# Patient Record
Sex: Female | Born: 1945 | Race: White | Hispanic: No | Marital: Married
Health system: Southern US, Community
[De-identification: ages and names within clinical notes are randomized; demographics above are authoritative.]

---

## 2006-02-01 ENCOUNTER — Ambulatory Visit: Payer: Self-pay

## 2014-02-27 ENCOUNTER — Ambulatory Visit: Payer: Self-pay | Admitting: Family Medicine

## 2014-02-28 ENCOUNTER — Ambulatory Visit: Payer: Self-pay | Admitting: Hematology and Oncology

## 2014-03-03 ENCOUNTER — Ambulatory Visit: Payer: Self-pay | Admitting: Hematology and Oncology

## 2014-03-03 LAB — COMPREHENSIVE METABOLIC PANEL
ALK PHOS: 118 U/L — AB
Albumin: 3.7 g/dL (ref 3.4–5.0)
Anion Gap: 6 — ABNORMAL LOW (ref 7–16)
BILIRUBIN TOTAL: 0.5 mg/dL (ref 0.2–1.0)
BUN: 12 mg/dL (ref 7–18)
Calcium, Total: 11.4 mg/dL — ABNORMAL HIGH (ref 8.5–10.1)
Chloride: 100 mmol/L (ref 98–107)
Co2: 32 mmol/L (ref 21–32)
Creatinine: 0.7 mg/dL (ref 0.60–1.30)
Glucose: 93 mg/dL (ref 65–99)
OSMOLALITY: 275 (ref 275–301)
Potassium: 5.4 mmol/L — ABNORMAL HIGH (ref 3.5–5.1)
SGOT(AST): 19 U/L (ref 15–37)
SGPT (ALT): 17 U/L (ref 12–78)
Sodium: 138 mmol/L (ref 136–145)
TOTAL PROTEIN: 8 g/dL (ref 6.4–8.2)

## 2014-03-03 LAB — CBC CANCER CENTER
BASOS PCT: 0.9 %
Basophil #: 0.1 x10 3/mm (ref 0.0–0.1)
EOS PCT: 3.6 %
Eosinophil #: 0.3 x10 3/mm (ref 0.0–0.7)
HCT: 39.7 % (ref 35.0–47.0)
HGB: 13.4 g/dL (ref 12.0–16.0)
Lymphocyte #: 2.4 x10 3/mm (ref 1.0–3.6)
Lymphocyte %: 26.9 %
MCH: 28.5 pg (ref 26.0–34.0)
MCHC: 33.8 g/dL (ref 32.0–36.0)
MCV: 84 fL (ref 80–100)
MONO ABS: 0.7 x10 3/mm (ref 0.2–0.9)
Monocyte %: 8 %
Neutrophil #: 5.4 x10 3/mm (ref 1.4–6.5)
Neutrophil %: 60.6 %
PLATELETS: 365 x10 3/mm (ref 150–440)
RBC: 4.72 10*6/uL (ref 3.80–5.20)
RDW: 14.7 % — AB (ref 11.5–14.5)
WBC: 8.9 x10 3/mm (ref 3.6–11.0)

## 2014-03-03 LAB — PROTIME-INR
INR: 1
Prothrombin Time: 13.3 secs (ref 11.5–14.7)

## 2014-03-04 ENCOUNTER — Ambulatory Visit: Payer: Self-pay | Admitting: Hematology and Oncology

## 2014-03-05 ENCOUNTER — Ambulatory Visit: Payer: Self-pay | Admitting: Hematology and Oncology

## 2014-03-19 ENCOUNTER — Ambulatory Visit: Payer: Self-pay | Admitting: Hematology and Oncology

## 2014-04-03 LAB — COMPREHENSIVE METABOLIC PANEL
ALK PHOS: 116 U/L
Albumin: 3.3 g/dL — ABNORMAL LOW (ref 3.4–5.0)
Anion Gap: 6 — ABNORMAL LOW (ref 7–16)
BUN: 12 mg/dL (ref 7–18)
Bilirubin,Total: 0.3 mg/dL (ref 0.2–1.0)
Calcium, Total: 11.4 mg/dL — ABNORMAL HIGH (ref 8.5–10.1)
Chloride: 99 mmol/L (ref 98–107)
Co2: 34 mmol/L — ABNORMAL HIGH (ref 21–32)
Creatinine: 0.87 mg/dL (ref 0.60–1.30)
EGFR (Non-African Amer.): >60
Glucose: 92 mg/dL (ref 65–99)
OSMOLALITY: 277 (ref 275–301)
Potassium: 4.2 mmol/L (ref 3.5–5.1)
SGOT(AST): 17 U/L (ref 15–37)
SGPT (ALT): 12 U/L (ref 12–78)
SODIUM: 139 mmol/L (ref 136–145)
Total Protein: 7.6 g/dL (ref 6.4–8.2)

## 2014-04-03 LAB — CBC CANCER CENTER
BASOS ABS: 0.1 x10 3/mm (ref 0.0–0.1)
Basophil %: 1 %
EOS PCT: 4.7 %
Eosinophil #: 0.4 x10 3/mm (ref 0.0–0.7)
HCT: 36.6 % (ref 35.0–47.0)
HGB: 12.1 g/dL (ref 12.0–16.0)
Lymphocyte #: 2.1 x10 3/mm (ref 1.0–3.6)
Lymphocyte %: 27 %
MCH: 28.2 pg (ref 26.0–34.0)
MCHC: 33.1 g/dL (ref 32.0–36.0)
MCV: 85 fL (ref 80–100)
MONO ABS: 0.7 x10 3/mm (ref 0.2–0.9)
MONOS PCT: 8.5 %
NEUTROS PCT: 58.8 %
Neutrophil #: 4.6 x10 3/mm (ref 1.4–6.5)
Platelet: 333 x10 3/mm (ref 150–440)
RBC: 4.3 10*6/uL (ref 3.80–5.20)
RDW: 14.7 % — ABNORMAL HIGH (ref 11.5–14.5)
WBC: 7.8 x10 3/mm (ref 3.6–11.0)

## 2014-04-10 LAB — CBC CANCER CENTER
BASOS PCT: 0.7 %
Basophil #: 0.1 x10 3/mm (ref 0.0–0.1)
EOS PCT: 3.9 %
Eosinophil #: 0.3 x10 3/mm (ref 0.0–0.7)
HCT: 35.3 % (ref 35.0–47.0)
HGB: 11.6 g/dL — AB (ref 12.0–16.0)
LYMPHS PCT: 18.6 %
Lymphocyte #: 1.4 x10 3/mm (ref 1.0–3.6)
MCH: 28 pg (ref 26.0–34.0)
MCHC: 32.9 g/dL (ref 32.0–36.0)
MCV: 85 fL (ref 80–100)
MONO ABS: 0.7 x10 3/mm (ref 0.2–0.9)
MONOS PCT: 9.4 %
Neutrophil #: 5.2 x10 3/mm (ref 1.4–6.5)
Neutrophil %: 67.4 %
PLATELETS: 307 x10 3/mm (ref 150–440)
RBC: 4.15 10*6/uL (ref 3.80–5.20)
RDW: 14.8 % — ABNORMAL HIGH (ref 11.5–14.5)
WBC: 7.7 x10 3/mm (ref 3.6–11.0)

## 2014-04-10 LAB — COMPREHENSIVE METABOLIC PANEL
ALBUMIN: 3.2 g/dL — AB (ref 3.4–5.0)
Alkaline Phosphatase: 108 U/L
Anion Gap: 7 (ref 7–16)
BUN: 10 mg/dL (ref 7–18)
Bilirubin,Total: 0.2 mg/dL (ref 0.2–1.0)
CO2: 30 mmol/L (ref 21–32)
CREATININE: 0.7 mg/dL (ref 0.60–1.30)
Calcium, Total: 9.1 mg/dL (ref 8.5–10.1)
Chloride: 99 mmol/L (ref 98–107)
EGFR (Non-African Amer.): >60
Glucose: 120 mg/dL — ABNORMAL HIGH (ref 65–99)
Osmolality: 272 (ref 275–301)
Potassium: 4.1 mmol/L (ref 3.5–5.1)
SGOT(AST): 12 U/L — ABNORMAL LOW (ref 15–37)
SGPT (ALT): 15 U/L
SODIUM: 136 mmol/L (ref 136–145)
Total Protein: 7.8 g/dL (ref 6.4–8.2)

## 2014-04-15 LAB — CBC CANCER CENTER
Basophil #: 0 x10 3/mm (ref 0.0–0.1)
Basophil %: 0.5 %
EOS PCT: 2.5 %
Eosinophil #: 0.2 x10 3/mm (ref 0.0–0.7)
HCT: 32.9 % — AB (ref 35.0–47.0)
HGB: 11 g/dL — AB (ref 12.0–16.0)
LYMPHS ABS: 1.2 x10 3/mm (ref 1.0–3.6)
Lymphocyte %: 17.5 %
MCH: 28.7 pg (ref 26.0–34.0)
MCHC: 33.5 g/dL (ref 32.0–36.0)
MCV: 85 fL (ref 80–100)
MONO ABS: 0.6 x10 3/mm (ref 0.2–0.9)
Monocyte %: 8.3 %
NEUTROS ABS: 5.1 x10 3/mm (ref 1.4–6.5)
Neutrophil %: 71.2 %
PLATELETS: 320 x10 3/mm (ref 150–440)
RBC: 3.85 10*6/uL (ref 3.80–5.20)
RDW: 15 % — AB (ref 11.5–14.5)
WBC: 7.1 x10 3/mm (ref 3.6–11.0)

## 2014-04-15 LAB — URINALYSIS, COMPLETE
BILIRUBIN, UR: NEGATIVE
Bacteria: NONE SEEN
Blood: NEGATIVE
Glucose,UR: NEGATIVE mg/dL (ref 0–75)
Ketone: NEGATIVE
LEUKOCYTE ESTERASE: NEGATIVE
NITRITE: NEGATIVE
PH: 7 (ref 4.5–8.0)
Protein: NEGATIVE
SPECIFIC GRAVITY: 1.008 (ref 1.003–1.030)
Squamous Epithelial: 5

## 2014-04-15 LAB — COMPREHENSIVE METABOLIC PANEL
ALBUMIN: 3 g/dL — AB (ref 3.4–5.0)
ANION GAP: 7 (ref 7–16)
Alkaline Phosphatase: 104 U/L
BUN: 13 mg/dL (ref 7–18)
Bilirubin,Total: 0.4 mg/dL (ref 0.2–1.0)
CHLORIDE: 96 mmol/L — AB (ref 98–107)
CREATININE: 0.62 mg/dL (ref 0.60–1.30)
Calcium, Total: 9.5 mg/dL (ref 8.5–10.1)
Co2: 32 mmol/L (ref 21–32)
EGFR (African American): >60
EGFR (Non-African Amer.): >60
Glucose: 126 mg/dL — ABNORMAL HIGH (ref 65–99)
OSMOLALITY: 272 (ref 275–301)
Potassium: 3.7 mmol/L (ref 3.5–5.1)
SGOT(AST): 10 U/L — ABNORMAL LOW (ref 15–37)
SGPT (ALT): 14 U/L
Sodium: 135 mmol/L — ABNORMAL LOW (ref 136–145)
Total Protein: 7.4 g/dL (ref 6.4–8.2)

## 2014-04-17 ENCOUNTER — Emergency Department: Payer: Self-pay | Admitting: Emergency Medicine

## 2014-04-17 LAB — CBC CANCER CENTER
BASOS PCT: 0.5 %
Basophil #: 0 x10 3/mm (ref 0.0–0.1)
Eosinophil #: 0.1 x10 3/mm (ref 0.0–0.7)
Eosinophil %: 0.8 %
HCT: 29.2 % — AB (ref 35.0–47.0)
HGB: 9.7 g/dL — AB (ref 12.0–16.0)
Lymphocyte #: 0.9 x10 3/mm — ABNORMAL LOW (ref 1.0–3.6)
Lymphocyte %: 10.9 %
MCH: 28.1 pg (ref 26.0–34.0)
MCHC: 33.1 g/dL (ref 32.0–36.0)
MCV: 85 fL (ref 80–100)
Monocyte #: 1.2 x10 3/mm — ABNORMAL HIGH (ref 0.2–0.9)
Monocyte %: 14.8 %
NEUTROS ABS: 5.9 x10 3/mm (ref 1.4–6.5)
Neutrophil %: 73 %
PLATELETS: 286 x10 3/mm (ref 150–440)
RBC: 3.44 10*6/uL — ABNORMAL LOW (ref 3.80–5.20)
RDW: 15.2 % — AB (ref 11.5–14.5)
WBC: 8.1 x10 3/mm (ref 3.6–11.0)

## 2014-04-17 LAB — CBC
HCT: 30.4 % — AB (ref 35.0–47.0)
HGB: 10 g/dL — ABNORMAL LOW (ref 12.0–16.0)
MCH: 28.4 pg (ref 26.0–34.0)
MCHC: 33 g/dL (ref 32.0–36.0)
MCV: 86 fL (ref 80–100)
Platelet: 299 10*3/uL (ref 150–440)
RBC: 3.53 10*6/uL — ABNORMAL LOW (ref 3.80–5.20)
RDW: 15.2 % — AB (ref 11.5–14.5)
WBC: 7.8 10*3/uL (ref 3.6–11.0)

## 2014-04-17 LAB — COMPREHENSIVE METABOLIC PANEL
Albumin: 2.5 g/dL — ABNORMAL LOW (ref 3.4–5.0)
Alkaline Phosphatase: 132 U/L — ABNORMAL HIGH
Anion Gap: 7 (ref 7–16)
BUN: 9 mg/dL (ref 7–18)
Bilirubin,Total: 0.4 mg/dL (ref 0.2–1.0)
CREATININE: 0.63 mg/dL (ref 0.60–1.30)
Calcium, Total: 8.6 mg/dL (ref 8.5–10.1)
Chloride: 99 mmol/L (ref 98–107)
Co2: 28 mmol/L (ref 21–32)
EGFR (African American): >60
EGFR (Non-African Amer.): >60
GLUCOSE: 107 mg/dL — AB (ref 65–99)
OSMOLALITY: 267 (ref 275–301)
Potassium: 3.8 mmol/L (ref 3.5–5.1)
SGOT(AST): 24 U/L (ref 15–37)
SGPT (ALT): 19 U/L
SODIUM: 134 mmol/L — AB (ref 136–145)
Total Protein: 7.4 g/dL (ref 6.4–8.2)

## 2014-04-17 LAB — APTT: Activated PTT: 27.4 secs (ref 23.6–35.9)

## 2014-04-17 LAB — PROTIME-INR
INR: 1.1
Prothrombin Time: 13.8 secs (ref 11.5–14.7)

## 2014-04-17 LAB — TROPONIN I: Troponin-I: <0.02 ng/mL

## 2014-04-17 LAB — URINE CULTURE

## 2014-04-19 ENCOUNTER — Ambulatory Visit: Payer: Self-pay | Admitting: Hematology and Oncology

## 2014-04-20 LAB — CULTURE, BLOOD (SINGLE)

## 2014-04-24 LAB — CBC CANCER CENTER
Basophil #: 0.1 x10 3/mm (ref 0.0–0.1)
Basophil %: 0.7 %
Eosinophil #: 0.1 x10 3/mm (ref 0.0–0.7)
Eosinophil %: 1 %
HCT: 29.1 % — ABNORMAL LOW (ref 35.0–47.0)
HGB: 9.6 g/dL — ABNORMAL LOW (ref 12.0–16.0)
LYMPHS ABS: 1 x10 3/mm (ref 1.0–3.6)
LYMPHS PCT: 12.8 %
MCH: 28 pg (ref 26.0–34.0)
MCHC: 33 g/dL (ref 32.0–36.0)
MCV: 85 fL (ref 80–100)
MONO ABS: 1.5 x10 3/mm — AB (ref 0.2–0.9)
Monocyte %: 19.1 %
NEUTROS ABS: 5.1 x10 3/mm (ref 1.4–6.5)
Neutrophil %: 66.4 %
PLATELETS: 463 x10 3/mm — AB (ref 150–440)
RBC: 3.43 10*6/uL — ABNORMAL LOW (ref 3.80–5.20)
RDW: 14.8 % — ABNORMAL HIGH (ref 11.5–14.5)
WBC: 7.6 x10 3/mm (ref 3.6–11.0)

## 2014-04-24 LAB — COMPREHENSIVE METABOLIC PANEL
ALT: 15 U/L
ANION GAP: 8 (ref 7–16)
AST: 12 U/L — AB (ref 15–37)
Albumin: 2.4 g/dL — ABNORMAL LOW (ref 3.4–5.0)
Alkaline Phosphatase: 175 U/L — ABNORMAL HIGH
BILIRUBIN TOTAL: 0.3 mg/dL (ref 0.2–1.0)
BUN: 11 mg/dL (ref 7–18)
CHLORIDE: 91 mmol/L — AB (ref 98–107)
Calcium, Total: 8.6 mg/dL (ref 8.5–10.1)
Co2: 31 mmol/L (ref 21–32)
Creatinine: 0.78 mg/dL (ref 0.60–1.30)
EGFR (African American): >60
EGFR (Non-African Amer.): >60
Glucose: 101 mg/dL — ABNORMAL HIGH (ref 65–99)
Osmolality: 260 (ref 275–301)
Potassium: 3.9 mmol/L (ref 3.5–5.1)
Sodium: 130 mmol/L — ABNORMAL LOW (ref 136–145)
Total Protein: 7.1 g/dL (ref 6.4–8.2)

## 2014-04-24 LAB — PATHOLOGY REPORT

## 2014-04-30 ENCOUNTER — Inpatient Hospital Stay: Payer: Self-pay | Admitting: Internal Medicine

## 2014-04-30 LAB — COMPREHENSIVE METABOLIC PANEL WITH GFR
Albumin: 2.3 g/dL — ABNORMAL LOW
Alkaline Phosphatase: 142 U/L — ABNORMAL HIGH
Anion Gap: 9
BUN: 7 mg/dL
Bilirubin,Total: 0.5 mg/dL
Calcium, Total: 8.4 mg/dL — ABNORMAL LOW
Chloride: 77 mmol/L — ABNORMAL LOW
Co2: 25 mmol/L
Creatinine: 0.26 mg/dL — ABNORMAL LOW
EGFR (African American): >60
EGFR (Non-African Amer.): >60
Glucose: 117 mg/dL — ABNORMAL HIGH
Osmolality: 224
Potassium: 4 mmol/L
SGOT(AST): 25 U/L
SGPT (ALT): 17 U/L
Sodium: 111 mmol/L — CL
Total Protein: 6.6 g/dL

## 2014-04-30 LAB — BASIC METABOLIC PANEL
ANION GAP: 13 (ref 7–16)
BUN: 6 mg/dL — ABNORMAL LOW (ref 7–18)
CALCIUM: 7.7 mg/dL — AB (ref 8.5–10.1)
CHLORIDE: 76 mmol/L — AB (ref 98–107)
CREATININE: 0.42 mg/dL — AB (ref 0.60–1.30)
Co2: 25 mmol/L (ref 21–32)
EGFR (African American): >60
EGFR (Non-African Amer.): >60
Glucose: 198 mg/dL — ABNORMAL HIGH (ref 65–99)
OSMOLALITY: 234 (ref 275–301)
Potassium: 3.3 mmol/L — ABNORMAL LOW (ref 3.5–5.1)
Sodium: 114 mmol/L — CL (ref 136–145)

## 2014-04-30 LAB — CBC WITH DIFFERENTIAL/PLATELET
Basophil #: 0 10*3/uL
Basophil %: 0.3 %
Eosinophil #: 0 10*3/uL
Eosinophil %: 0.2 %
HCT: 29.5 % — ABNORMAL LOW
HGB: 9.9 g/dL — ABNORMAL LOW
Lymphocyte %: 13.2 %
Lymphs Abs: 1.6 10*3/uL
MCH: 27.8 pg
MCHC: 33.4 g/dL
MCV: 83 fL
Monocyte #: 1.4 10*3/uL — ABNORMAL HIGH
Monocyte %: 12.1 %
Neutrophil #: 8.9 10*3/uL — ABNORMAL HIGH
Neutrophil %: 74.2 %
Platelet: 518 10*3/uL — ABNORMAL HIGH
RBC: 3.55 X10 6/mm 3 — ABNORMAL LOW
RDW: 14.7 % — ABNORMAL HIGH
WBC: 11.9 10*3/uL — ABNORMAL HIGH

## 2014-04-30 LAB — URINALYSIS, COMPLETE
Bilirubin,UR: NEGATIVE
Blood: NEGATIVE
Glucose,UR: 50 mg/dL
Leukocyte Esterase: NEGATIVE
Nitrite: NEGATIVE
Ph: 6
Protein: NEGATIVE
RBC,UR: 2 /HPF
Specific Gravity: 1.018
Squamous Epithelial: 3
WBC UR: 3 /HPF

## 2014-04-30 LAB — OSMOLALITY, URINE: OSMOLALITY: 445 mosm/kg

## 2014-04-30 LAB — OSMOLALITY: Osmolality: 229 mOsm/kg — CL (ref 280–301)

## 2014-04-30 LAB — LIPASE, BLOOD: Lipase: 35 U/L — ABNORMAL LOW

## 2014-05-01 LAB — CBC WITH DIFFERENTIAL/PLATELET
Basophil #: 0 10*3/uL (ref 0.0–0.1)
Basophil %: 0 %
EOS PCT: 0 %
Eosinophil #: 0 10*3/uL (ref 0.0–0.7)
HCT: 25.2 % — ABNORMAL LOW (ref 35.0–47.0)
HGB: 8.4 g/dL — AB (ref 12.0–16.0)
LYMPHS PCT: 11.1 %
Lymphocyte #: 1.2 10*3/uL (ref 1.0–3.6)
MCH: 27.6 pg (ref 26.0–34.0)
MCHC: 33.4 g/dL (ref 32.0–36.0)
MCV: 83 fL (ref 80–100)
MONO ABS: 1.2 x10 3/mm — AB (ref 0.2–0.9)
Monocyte %: 11 %
NEUTROS ABS: 8.4 10*3/uL — AB (ref 1.4–6.5)
NEUTROS PCT: 77.9 %
Platelet: 444 10*3/uL — ABNORMAL HIGH (ref 150–440)
RBC: 3.05 10*6/uL — AB (ref 3.80–5.20)
RDW: 14.4 % (ref 11.5–14.5)
WBC: 10.9 10*3/uL (ref 3.6–11.0)

## 2014-05-01 LAB — BASIC METABOLIC PANEL
Anion Gap: 13 (ref 7–16)
BUN: 8 mg/dL (ref 7–18)
CHLORIDE: 79 mmol/L — AB (ref 98–107)
CREATININE: 0.46 mg/dL — AB (ref 0.60–1.30)
Calcium, Total: 7.5 mg/dL — ABNORMAL LOW (ref 8.5–10.1)
Co2: 25 mmol/L (ref 21–32)
EGFR (African American): >60
GLUCOSE: 105 mg/dL — AB (ref 65–99)
OSMOLALITY: 235 (ref 275–301)
Potassium: 3.1 mmol/L — ABNORMAL LOW (ref 3.5–5.1)
Sodium: 117 mmol/L — CL (ref 136–145)

## 2014-05-01 LAB — TSH: Thyroid Stimulating Horm: 1.15 u[IU]/mL

## 2014-05-01 LAB — SODIUM: Sodium: 118 mmol/L — CL (ref 136–145)

## 2014-05-20 ENCOUNTER — Ambulatory Visit: Payer: Self-pay | Admitting: Hematology and Oncology

## 2014-08-19 DEATH — deceased

## 2015-01-10 NOTE — Consult Note (Signed)
Reason for Visit: This 69 year old Female patient presents to the clinic for initial evaluation of  lung cancer .   Referred by Dr. Wendie Simmer.  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 Chest x-ray done 02/24/2014 showed a large mass in the left anterior chest measuring approximately 12 x 8.5 x 8 cm. Mass extends to the mediastinum and left heart border was nearly inseparable from the left atrial appendage concerning for cardiac involvement. PET CT scan was performed showing large hypermetabolic lingular mass consistent with lung cancer mild hypermetabolic pert prevascular and subcarinal lymph nodes are also seen. No evidence of metastatic disease elsewhere was noted. MRI of the brain showed no evidence to suggest metastatic disease.CT study biopsy was positive for squamous cell carcinoma. Patient was discussed with her weekly tumor conference and inoperable based on probable cardiac involvement and recommendation for concurrent chemoradiation was made. She seen today for evaluation doing fairly well having no dysphasia at this time chest pain is stable. No cough hemoptysis or chest tightness does have significant dyspnea on exertion.  Past Hx:    Adrenal mass:    Arthritis:    Hernia, Hiatal:    hysterectomy:   Past, Family and Social History:  Past Medical History positive   Gastrointestinal hiatal hernia   Past Surgical History hysterectomy   Past Medical History Comments  arthritis   Family History positive   Family History Comments father with lung cancer   Social History positive   Social History Comments 50-pack-year smoking history   Additional Past Medical and Surgical History seen by herself tonight   Allergies:   Bee Stings: Anaphylaxis  Other -Explain in Comment Field: Other  Home Meds:  Home Medications: Medication Instructions Status  acetaminophen-oxyCODONE 325 mg-5 mg oral tablet 1 tab(s) orally every 4 hours, As Needed Active  cholestoff   once a day Active  famotidine 20 mg oral tablet 1 tab(s) orally 2 times a day Active  PARoxetine 20 mg oral tablet 1 tab(s) orally once a day Active  Benadryl Allergy 25 mg oral tablet 1 tab(s) orally once a day Active  Garlic - oral tablet 1000 milligram(s) orally once a day Active  vitamin E 1000 intl units oral capsule 1 cap(s) orally once a day Active  estroven 1 tab(s) orally once a day Active  hyoscyamine 1  orally 4 times a day Active   Review of Systems:  General negative   Performance Status (ECOG) 0   Skin negative   Breast negative   Ophthalmologic negative   ENMT negative   Respiratory and Thorax see HPI   Cardiovascular negative   Gastrointestinal negative   Genitourinary negative   Musculoskeletal negative   Neurological negative   Psychiatric negative   Hematology/Lymphatics negative   Endocrine negative   Allergic/Immunologic negative   Review of Systems   review of systems obtained from nurses notes  Nursing Notes:  Nursing Vital Signs and Chemo Nursing Nursing Notes: *CC Vital Signs Flowsheet:   30-Jun-15 10:36  Temp Temperature 96.3  Pulse Pulse 90  Respirations Respirations 21  SBP SBP 121  DBP DBP 77  Current Weight (kg) (kg) 58.1  Height (cm) centimeters 19.4  BSA (m2) 0.3   Physical Exam:  General/Skin/HEENT:  General normal   Skin normal   Eyes normal   ENMT normal   Head and Neck normal   Additional PE well-developed  female in NAD. No venous jugular distention is noted no cervical or supraclavicular adenopathy is appreciated. Lungs are clear to A&P cardiac examination shows regular rate and rhythm abdomen is benign.   Breasts/Resp/CV/GI/GU:  Respiratory and Thorax normal   Cardiovascular normal   Gastrointestinal normal   Genitourinary normal   MS/Neuro/Psych/Lymph:  Musculoskeletal normal   Neurological normal   Lymphatics normal   Other Results:  Radiology Results: LabUnknown:    16-Jun-15 09:59, PET/CT Scan Lung Cancer Initial Staging  PACS Image     23-Jun-15 11:36, MRI Brain  With/Without Contrast  PACS Image   MRI:  MRI Brain  With/Without Contrast   REASON FOR EXAM:    lung CA staging  COMMENTS:       PROCEDURE: MR  - MR BRAIN WO/W CONTRAST  - Mar 11 2014 11:36AM     CLINICAL DATA:  Staging lung cancer diagnosed 2 weeks ago.  Occasional headaches.    EXAM:  MRI HEAD WITHOUT AND WITH CONTRAST    TECHNIQUE:  Multiplanar, multiecho pulse sequences of the brain and surrounding  structures were obtained without and with intravenous contrast.  CONTRAST:  11 cc MultiHance.    COMPARISON:  None.    FINDINGS:  No acute infarct.    No intracranial hemorrhage.    No intracranial enhancing lesion or bony destructive lesion to  suggest presence of intracranial metastatic disease.    Scattered punctate and patchy white matter type changes most  consistent with result of small vessel disease mild to moderate in  degree.  Major intracranial vascular structures are patent although there may  be narrowing of the left vertebral artery.    Partial opacification mastoid air cells bilaterally without  obstructing lesion noted as cause of eustachian tube dysfunction.    Cervical medullary junction, pituitary region, pineal region and  orbital structures unremarkable.     IMPRESSION:  No evidence of intracranial metastatic disease.    Mild to moderate small vessel disease type  changes.    Partial opacification mastoid air cells bilaterally.  Question narrowing of the left vertebral artery.      Electronically Signed    By: Bridgett LarssonSteve  Olson M.D.    On: 03/11/2014 13:39         Verified By: Fuller CanadaSTEVEN R. OLSON, M.D.,  Nuclear Med:    16-Jun-15 09:59, PET/CT Scan Lung Cancer Initial Staging  PET/CT Scan Lung Cancer Initial Staging   REASON FOR EXAM:    lung CA initial staging  COMMENTS:       PROCEDURE: PET - PET/CT INIT STAGING LUNG CA  - Mar 04 2014  9:59AM     CLINICAL DATA:  Initial treatment strategy for lung mass.    EXAM:  NUCLEAR MEDICINE PET SKULL BASE TO THIGH    TECHNIQUE:  11.53 mCi F-18 FDG was injected intravenously. Full-ring PET imaging  was performed from the skull base to thigh after the radiotracer. CT  data was obtained and used for attenuation correction and anatomic  localization.  FASTING BLOOD GLUCOSE:  Value: 94 mg/dl    COMPARISON:  Chest CT 02/27/2014  FINDINGS:  NECK    No hypermetabolic lymph nodes in the neck.    CHEST    Large hypermetabolic and partially necrotic left lung mass with SUV  max of 14.5. There are mildly hypermetabolic prevascular and  subcarinal lymph nodes with SUV max of 3.0. No contralateral  adenopathy. No metastatic pulmonary nodules are identified. No  malignant pleural effusion.    ABDOMEN/PELVIS    No abnormal hypermetabolic activity within the liver, pancreas,  adrenal glands, or spleen. No hypermetabolic lymph nodes in the  abdomen or pelvis. The left adrenal gland nodule is not  hypermetabolic and measures 1.25 lb tear venous and is consistent  with a benign adrenal gland adenoma.    SKELETON    No focal hypermetabolic activity to suggest skeletal metastasis.     IMPRESSION:  Large hypermetabolic lingular mass and associated  obstructive/drowned lung consistent with lung cancer. There are  mildly hypermetabolic prevascular and subcarinal lymph nodes.    No findings for  metastatic disease involving the neck, lungs,  abdomen or pelvis.      Electronically Signed    By: Loralie Champagne M.D.    On: 03/04/2014 13:23         Verified By: Cristi Loron, M.D.,   Relevent Results:   Relevant Scans and Labs MRI scans and PET scans and CT scans reviewed.   Assessment and Plan: Impression:   stage IIIa squamous cell carcinoma of left lung in 69 year old female Plan:   at this time I have recommended radiation therapy with concurrent chemotherapy for curative intent. I believe based on her involvement of the heart it would be difficult scanner 3-dimensional treatment planning to avoid AV 25 of less than 10 ml. I would use IM RT treatment planning and delivery despairs much cardiac volume as possible with multiple field beam arrangement. I will plan on delivering 6000 cGy over 6 weeks with concurrent chemotherapy. I also believe much V. 24 [place] exceed 35% of normal lung volume based on the large size of the mass using standard 3-dimensional treatment. For these reasons I would opt for IM RT treatment planning and delivery.Risks and benefits of treatment including destruction of normal lung cardiac exposure, alteration of blood counts, possible dysphasia, possible alteration of blood counts, all were discussed in detail with the patient. She seems to comprehend my treatment plan well. I have set her up for CT simulation early next week. Will coordinate her chemotherapy concurrently with her radiation.  I would like to take this opportunity for allowing me to participate in the care of your patient..  CC Referral:  cc: Dr.Feldpausch   Electronic Signatures: Rebeca Alert (MD)  (Signed 30-Jun-15 13:16)  Authored: HPI, Diagnosis, Past Hx, PFSH, Allergies, Home Meds, ROS, Nursing Notes, Physical Exam, Other Results, Relevent Results, Encounter Assessment and Plan, CC Referring Physician   Last Updated: 30-Jun-15 13:16 by Rebeca Alert (MD)

## 2015-01-10 NOTE — Discharge Summary (Signed)
PATIENT NAME:  Bailey RobinsBROWN, Sandrea E MR#:  [id number] DATE OF BIRTH:  [birth year]  DATE OF ADMISSION:  04/30/2014 DATE OF DISCHARGE:  05/02/2014  ADMISSION DIAGNOSES: 1.  Hyponatremia.  2.  History of lung cancer.   DISCHARGE DIAGNOSES:  1.  Hyponatremia.  2.  Nausea and vomiting. 3.  Anemia of chronic disease.  4.  History of lung cancer.   CONSULTATIONS: Palliative care.  HOSPITAL COURSE: A 69 year old female who presented with nausea and vomiting and found to have severe hyponatremia. For further details, please refer to the H and P.   1.  Hyponatremia. The patient was admitted to the hospitalist service. Palliative care was consulted regarding her lung cancer. As far as her hyponatremia was considered, she was placed on IV fluids. This was thought to be secondary to nausea and vomiting. Her sodium did improve with IV fluids; however, due to her comorbidities with left lung cancer and her poor overall condition, palliative care discussed with the family options. They decided due to her deconditioned state, to pursue comfort care and hospice home. The patient was sent to hospice home on 05/02/2014.   2.  Lung cancer. As mentioned, the patient is now on comfort care and will be admitted to hospice home.   DISCHARGE MEDICATIONS:   1.  Prochlorperazine 10 mg t.i.d.  2.  Benadryl daily.  3.  Zofran 4 mg q. 8 hours.  4.  Fentanyl 12 mcg q. 72 hours.  5.  Paxil 30 mg daily.  6.  Tylenol 325 two tablets q. 4 hours p.r.n. pain.  7.  Albuterol ipratropium inhaled.  8.  Olanzapine 2.5 mg daily.  9.  Ativan 0.5 one to 2 tablets sublingual 2-4 hours p.r.n. agitation and anxiety.  10.  Morphine 0.25 to 0.5 mL every 1-2 hours p.r.n. pain.   DISCHARGE DIET: As tolerated.   DISCHARGE OXYGEN: As needed.   DISPOSITION:  Family was at bedside. The patient is being transferred to a hospice.  time 31 minutes    ____________________________ Kamaury Cutbirth P. Juliene PinaMody, MD spm:TT D: 05/02/2014 11:16:01  ET T: 05/02/2014 12:42:07 ET JOB#: [id number]  cc: Freya Zobrist P. Juliene PinaMody, MD, <Dictator> Janyth ContesSITAL P Donnalee Cellucci MD ELECTRONICALLY SIGNED 05/02/2014 14:06

## 2015-01-10 NOTE — H&P (Signed)
PATIENT NAME:  Bailey Bryant, Bailey Bryant MR#:  [id number] DATE OF BIRTH:  1945-12-11  DATE OF ADMISSION:  04/30/2014  PRIMARY CARE PROVIDER:  MD  EMERGENCY DEPARTMENT REFERRING DOCTOR: Dorothea Glassman, MD  CHIEF COMPLAINT: Nausea, vomiting, abdominal pain.   HISTORY OF PRESENT ILLNESS: The patient is a 69 year old white female who has had nausea and vomiting ongoing for the past few weeks, who also has been diagnosed with  non-small cell lung cancer stage IIIA, who was diagnosed after she had abnormal chest x-ray on 02/24/2014. Subsequent CT on June 11th showed that the mass extended to the mediastinum and the left heart border nearly inseparable from the lung cancer. The patient was initially treated with chemotherapy 1 round. After that she could not tolerate that and did not want this further. She was referred to radiation therapy, which she has received one radiation therapy. However, she continues to have nausea and vomiting. She has been seen in the Emergency Room and has been seen in the oncology center for similar complaints. The patient today came in with similar type of complaints. Also complaining of abdominal pain as well. Her sodium is noted to be 111. It was 130 on August 6th.  She has had intermittent chills, but no fevers. She has not had any hematemesis or hematochezia. She also is having a loose diarrhea and also complaining of abdominal pain in the lower abdomen. She does not have any chest pains. She does have some chronic shortness of breath. According to the son who is at bedside, the patient is chronically uneasy and has not been comfortable since over the past few weeks to months.   PAST MEDICAL HISTORY:   Significant for: 1.  Non-small cell lung cancer, stage IIIA. 2.  Bilateral lower extremity swelling.  3.  Adrenal mass per PET scan, thought to be adrenal adenoma.  4.  Arthritis.  5.  Hiatal hernia.  6.  Status post hysterectomy.   ALLERGIES: BEE STING.   MEDICATIONS AT HOME: She is  on vitamin E 1000 International Units daily, prochlorperazine 10 mg 1 tab p.o. t.i.d., Pepcid 40 one tab p.o. daily, paroxetine 20, 1-1/2 tabs p.o. daily, Zofran 4 mg q. 8 p.r.n., Klor-Con 20 mEq 2 tabs b.i.d., hyoscyamine 0.125 one tab p.o. 4 times a day, garlic 1 tab p.o. daily, Lasix 20 one tab p.o. daily, fentanyl 12 mcg every 72 hours, Estroven 1 tab p.o. daily, CholestOff 1 tab p.o. daily, Benadryl 25 once a day at bedtime, Aleve 220 one tab p.o. b.i.d., Tylenol 650 q. 4 p.r.n.   SOCIAL HISTORY: Continues to smoke less than 1 pack per day. Denies daily alcohol or drug use.   FAMILY HISTORY: Positive for hypertension.   REVIEW OF SYSTEMS:   CONSTITUTIONAL: Complains of subjective fevers, fatigue, weakness, pain, weight loss.  EYES: No blurred or double vision. No pain. No redness. No inflammation. No glaucoma. No cataracts.  ENT: No tinnitus. No ear pain. No hearing loss. No seasonal or year-round allergies. No difficulty swallowing.  RESPIRATORY: Denies any cough, wheezing. Has shortness of breath.  CARDIOVASCULAR: Denies any chest pain, orthopnea. Has chronic significant edema. No palpitations, syncope.  GASTROINTESTINAL: Complains of nausea, vomiting, diarrhea. Complains of abdominal pain. No hematemesis. No melena.  GENITOURINARY: Denies any dysuria, hematuria, renal colic, or frequency.  ENDOCRINE: Denies any polyuria, nocturia or thyroid problems.  HEMATOLOGIC AND LYMPHATIC: Denies anemia, easy bruisability, or bleeding.  SKIN: No acne. No rash.  MUSCULOSKELETAL: Has pain related to arthritis.  NEUROLOGICAL: No numbness,  CVA, TIA.  PSYCHIATRIC: Does have anxiety related to her cancer.   PHYSICAL EXAMINATION: VITAL SIGNS: Temperature 98.4, pulse was 83, respirations 18, blood pressure 186/98, O2 of 97%.  GENERAL: The patient is a chronically ill-appearing female, appears very uncomfortable.  HEENT: Head atraumatic, normocephalic. Pupils equally round, reactive to light and  accommodation. There is no conjunctival pallor. No scleral icterus. Nasal exam shows no drainage or ulceration.  Oropharynx is clear without any exudate.   NECK: Supple without any JVD.  CARDIOVASCULAR: Regular rate and rhythm. No murmurs, rubs, clicks, or gallops.  LUNGS: Clear to auscultation bilaterally without any rales, rhonchi, wheezing.  ABDOMEN: Soft, nontender, nondistended. Positive bowel sounds x 4.  EXTREMITIES: 2+ edema.  SKIN: No rash.  LYMPHATICS: No lymph nodes palpable.  VASCULAR: Good DP, PT pulses.  PSYCHIATRIC: Appears a little anxious.  NEUROLOGIC: Awake, alert, oriented x 3. No focal deficits.   LABORATORY DATA: Glucose 117, BUN 7, creatinine 0.26, sodium 111, potassium 4.0, chloride 77. CO2 is 25, calcium is 8.4, lipase 35. LFTs:   2.38, bilirubin total 0.5, AST 25, ALT 17. WBC 11.9, hemoglobin 9.9, platelet count 518,000.   ASSESSMENT AND PLAN: The patient is a 69 year old white female with stage IIIA lung cancer with extension near the heart. Could not tolerate chemotherapy. Comes in with nausea, vomiting, abdominal pain.  1.  Nausea, vomiting, abdominal pain, possibly due to metastatic disease. At this time, CT of the abdomen and pelvis is pending. We will rule out Clostridium difficile,  supportive care.  2.  Severe hyponatremia, possibly due to dehydration. Will give her IV fluids, follow her sodium will check serum and urine osmolality.  Also need to consider syndrome of inappropriate antidiuretic hormone in light of her lung cancer.  3.  Non-small cell lung cancer. Prognosis very poor. Discussed with the son. I will ask palliative care input. Consider making patient comfort care.  4.  Nicotine addiction. Smoking cessation done, 4 minutes spent. Nicotine patch offered and will be started.    TIME SPENT: Fifty-five minutes on this patient.    ____________________________ Lacie ScottsShreyang H. Allena KatzPatel, MD shp:LT D: 04/30/2014 18:46:00 ET T: 04/30/2014 19:33:16  ET JOB#: [id number]  cc: Lilias Lorensen H. Allena KatzPatel, MD, <Dictator> Charise CarwinSHREYANG H Kamryn Gauthier MD ELECTRONICALLY SIGNED 05/06/2014 14:27

## 2015-08-22 IMAGING — CT CT ANGIO CHEST
2 of 5 series · 18 of 36 positions shown · IV contrast (APPLIED)
Comparison: PET-CT 03/06/2014

CLINICAL DATA: Left chest pain. History of lung carcinoma post
radiation and chemotherapy.

EXAM:
CT ANGIOGRAPHY CHEST WITH CONTRAST
TECHNIQUE: Multidetector CT imaging of the chest was performed using the
standard protocol during bolus administration of intravenous
contrast. Multiplanar CT image reconstructions and MIPs were
obtained to evaluate the vascular anatomy.
CONTRAST:  100 mL Isovue 370 IV

[Series 5: pe 1.0 thins · axial · 0.68mm/px · z∈[-683,-416]mm · 17 of 301 slices shown]
[im 17/301  lung]
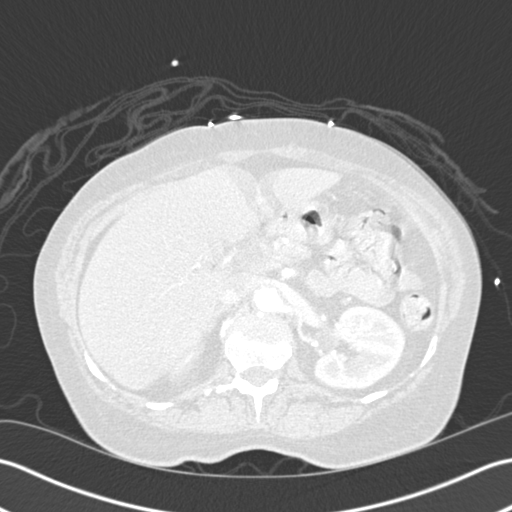
[im 34/301  mediastinal]
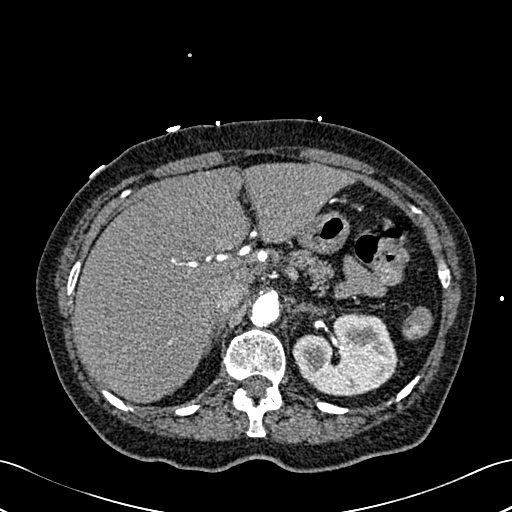
[im 51/301  lung]
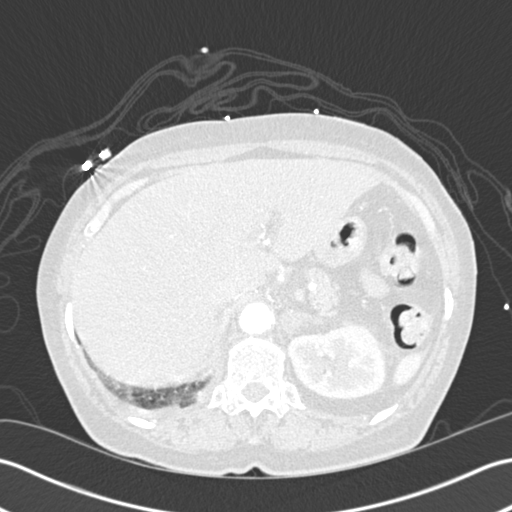
[im 67/301  mediastinal]
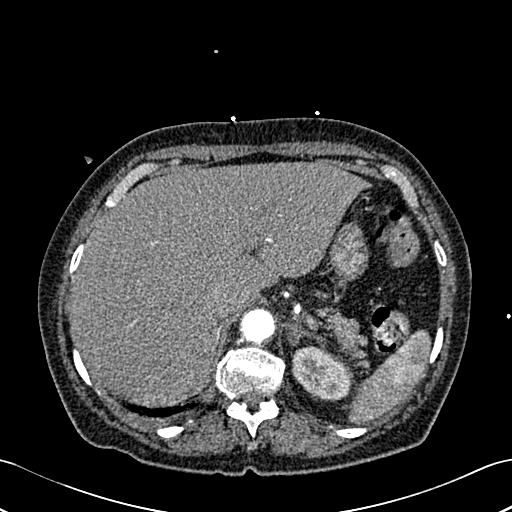
[im 84/301  lung]
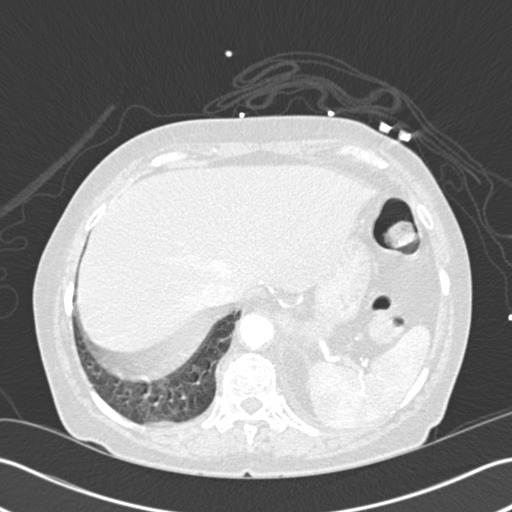
[im 101/301  mediastinal]
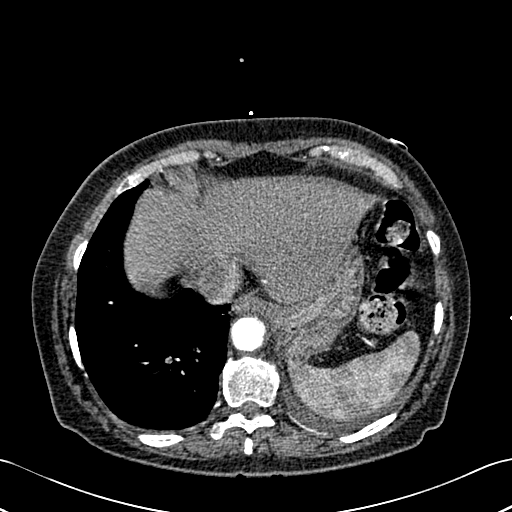
[im 117/301  lung]
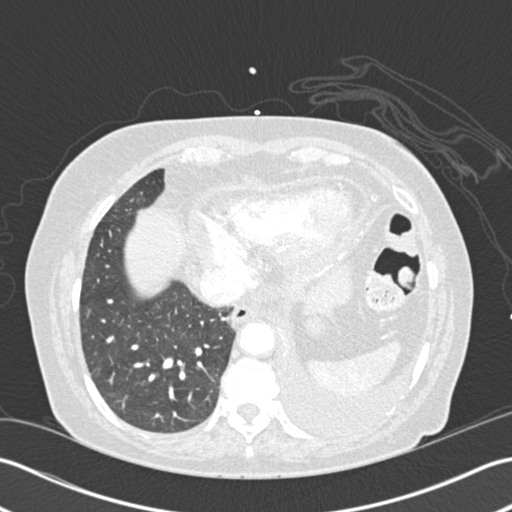
[im 134/301  mediastinal]
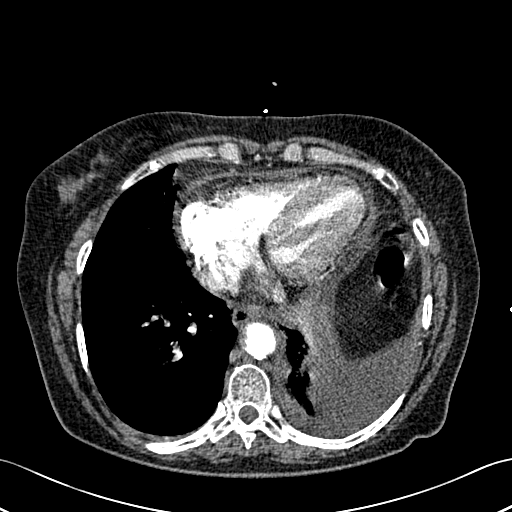
[im 151/301  lung]
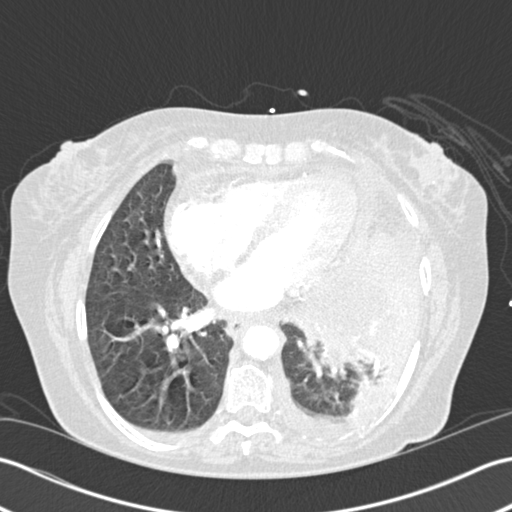
[im 167/301  mediastinal]
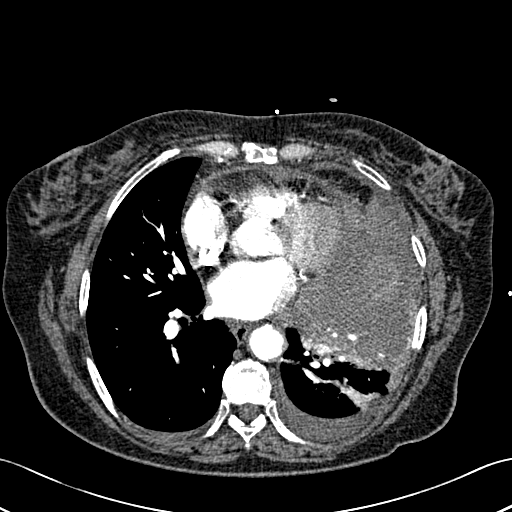
[im 184/301  lung]
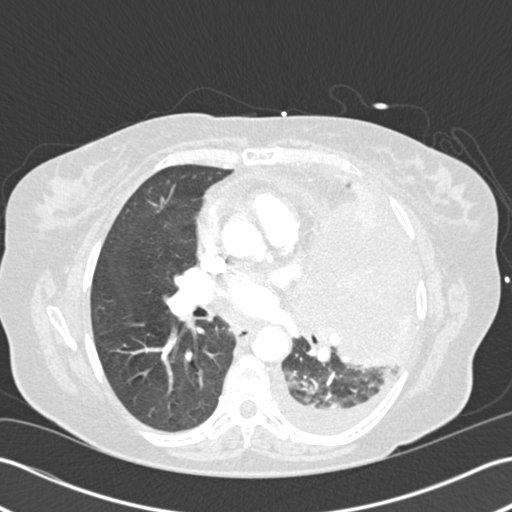
[im 201/301  mediastinal]
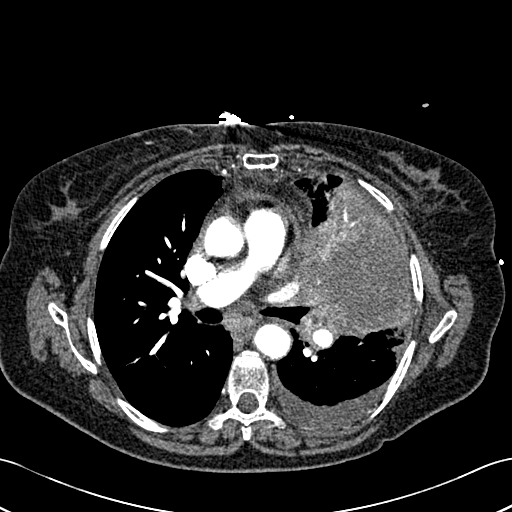
[im 217/301  lung]
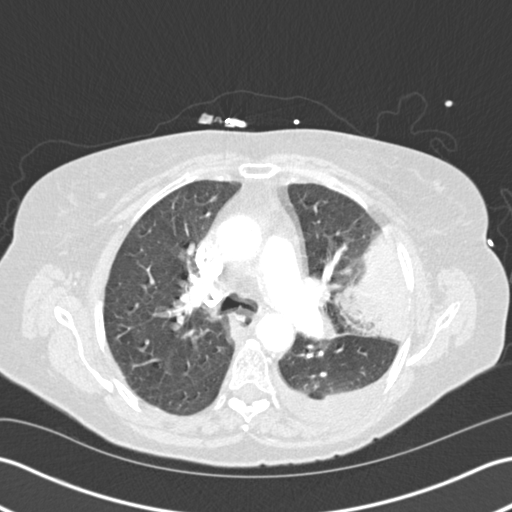
[im 234/301  mediastinal]
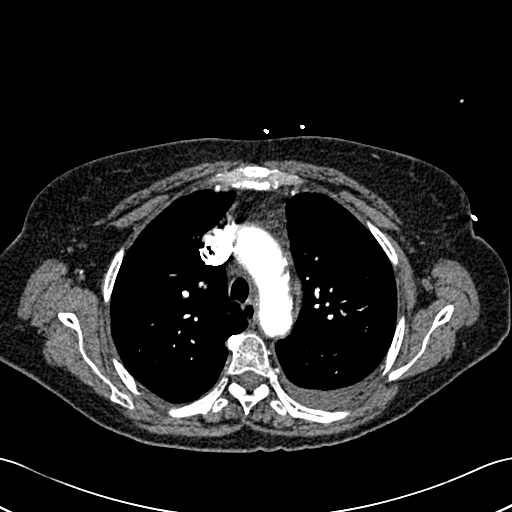
[im 251/301  lung]
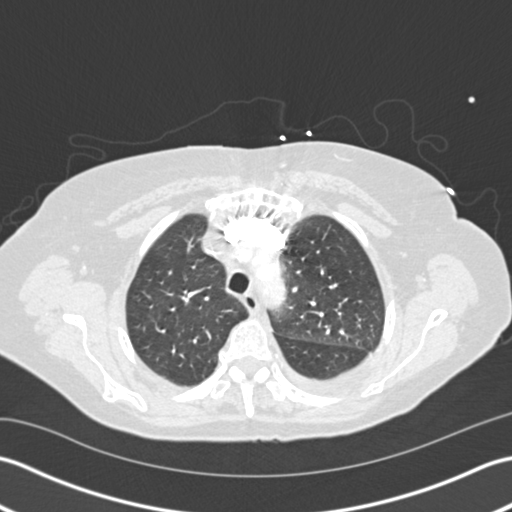
[im 267/301  mediastinal]
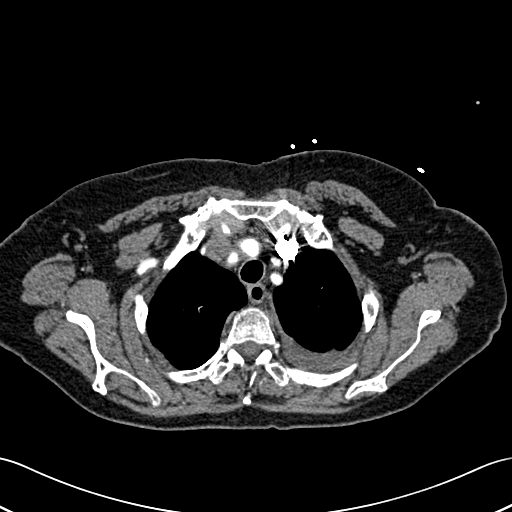
[im 284/301  lung]
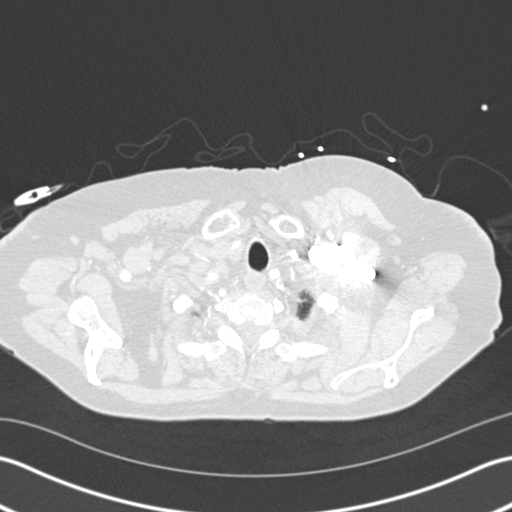

[Series 7: cor pe 2.0 mpr · coronal · 0.68mm/px · 1 of 138 slices shown]
[im 69/138  mediastinal]
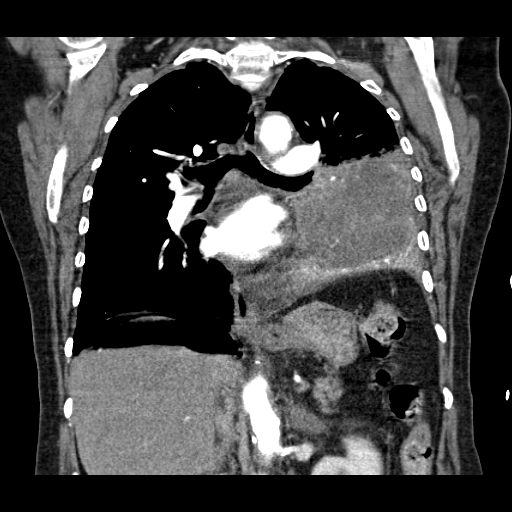

[18 of 36 positions shown; findings below may reference images not displayed]

FINDINGS: Satisfactory opacification of pulmonary arteries noted, and there is
no evidence of pulmonary emboli. Patient breathing during the
acquisition degrades some of the images. Adequate contrast
opacification of the thoracic aorta with no evidence of dissection,
aneurysm, or stenosis. There is classic 3 vessel brachiocephalic
arch anatomy without proximal stenosis. Patchy coronary and aortic
calcifications. Lingular mass with extensive lingular and left lower
lobe consolidation/atelectasis/drowned lung with central low
attenuation suggesting necrosis as before. Process abuts the
parietal pleura over a large region without definite evidence of
chest wall invasion. New scalp pleural effusions left greater than
right. Progressive sub carinal adenopathy appears enlargement of
prevascular and AP window adenopathy. Trace pericardial effusion has
increased. Right lung remains clear. Minimal spurring in the mid
thoracic spine. Stable left adrenal fullness. Remainder visualized
upper abdomen unremarkable.

Review of the MIP images confirms the above findings.
IMPRESSION: 1. Negative for acute PE or thoracic aortic dissection.
2. Central left lung mass with extensive peripheral drowned
lung/consolidation as before.
3. New small pleural effusions left greater than right in small
pericardial effusion.
4. Progressive mediastinal adenopathy.
